# Patient Record
Sex: Male | Born: 1990 | Race: White | Hispanic: No | Marital: Single
Health system: Southern US, Community
[De-identification: ages and names within clinical notes are randomized; demographics above are authoritative.]

---

## 2019-03-06 ENCOUNTER — Emergency Department: Payer: Self-pay

## 2019-03-06 ENCOUNTER — Emergency Department
Admission: EM | Admit: 2019-03-06 | Discharge: 2019-03-06 | Disposition: A | Discharge status: Home / Self Care | Payer: Self-pay | Attending: Emergency Medicine | Admitting: Emergency Medicine

## 2019-03-06 DIAGNOSIS — Y939 Activity, unspecified: Secondary | ICD-10-CM | POA: Insufficient documentation

## 2019-03-06 DIAGNOSIS — S61216A Laceration without foreign body of right little finger without damage to nail, initial encounter: Secondary | ICD-10-CM | POA: Insufficient documentation

## 2019-03-06 DIAGNOSIS — W231XXA Caught, crushed, jammed, or pinched between stationary objects, initial encounter: Secondary | ICD-10-CM | POA: Insufficient documentation

## 2019-03-06 DIAGNOSIS — Y929 Unspecified place or not applicable: Secondary | ICD-10-CM | POA: Insufficient documentation

## 2019-03-06 DIAGNOSIS — Y999 Unspecified external cause status: Secondary | ICD-10-CM | POA: Insufficient documentation

## 2019-03-06 MED ORDER — MELOXICAM 15 MG PO TABS: 15.0000 mg | ORAL_TABLET | Freq: Every day | ORAL | 1 refills | Status: AC

## 2019-03-06 NOTE — ED Triage Notes (Signed)
Pt reports he slammed his right 5th digit into truck door this evening. Swelling and 1/2 inch laceration noted. No bleeding at this time.

## 2019-03-06 NOTE — ED Notes (Signed)
Pt to the er for pain to the 5th digit on the right hand. Pt states he shut his truck door and now has swelling and bruising and a small abrasion to the digit. Pt has good blood flow and cap refill is good. Pt states he has lost feeling in the finger and decreased ROM.

## 2019-03-06 NOTE — ED Provider Notes (Signed)
Emergency Department Provider Note  ____________________________________________  Time seen: Approximately 10:02 PM  I have reviewed the triage vital signs and the nursing notes.   HISTORY  Chief Complaint Finger Injury   Historian Patient     HPI Jeffrey Warren is a 28 y.o. male presents to the emergency department with right fifth digit pain.  Patient shut his car door on his hand.  He has noticed some swelling has a small abrasion along the dorsal aspect of the digit.  Patient has had some numbness of the right fifth digit since injury occurred.  No other alleviating measures have been attempted.  Tetanus status is up-to-date.   No past medical history on file.   Immunizations up to date:  Yes.     No past medical history on file.  There are no problems to display for this patient.     Prior to Admission medications   Medication Sig Start Date End Date Taking? Authorizing Provider  meloxicam (MOBIC) 15 MG tablet Take 1 tablet (15 mg total) by mouth daily for 7 days. 03/06/19 03/13/19  Orvil Feil, PA-C    Allergies Patient has no allergy information on record.  No family history on file.  Social History Social History   Tobacco Use  . Smoking status: Not on file  Substance Use Topics  . Alcohol use: Not on file  . Drug use: Not on file     Review of Systems  Constitutional: No fever/chills Eyes:  No discharge ENT: No upper respiratory complaints. Respiratory: no cough. No SOB/ use of accessory muscles to breath Gastrointestinal:   No nausea, no vomiting.  No diarrhea.  No constipation. Musculoskeletal: Patient has right fifth digit pain. Skin: Negative for rash, abrasions, lacerations, ecchymosis.    ____________________________________________   PHYSICAL EXAM:  VITAL SIGNS: ED Triage Vitals  Enc Vitals Group     BP 03/06/19 1902 (!) 143/88     Pulse Rate 03/06/19 1902 76     Resp 03/06/19 1902 17     Temp 03/06/19 1902 98.6 F  (37 C)     Temp Source 03/06/19 1902 Oral     SpO2 03/06/19 1902 98 %     Weight --      Height --      Head Circumference --      Peak Flow --      Pain Score 03/06/19 1945 4     Pain Loc --      Pain Edu? --      Excl. in GC? --      Constitutional: Alert and oriented. Well appearing and in no acute distress. Eyes: Conjunctivae are normal. PERRL. EOMI. Head: Atraumatic. Cardiovascular: Normal rate, regular rhythm. Normal S1 and S2.  Good peripheral circulation. Respiratory: Normal respiratory effort without tachypnea or retractions. Lungs CTAB. Good air entry to the bases with no decreased or absent breath sounds Gastrointestinal: Bowel sounds x 4 quadrants. Soft and nontender to palpation. No guarding or rigidity. No distention. Musculoskeletal: No flexor or extensor tendon deficits appreciated with right fifth digit testing.  Palpable radial pulse, right.  Capillary refill less than 2 seconds on the right. Neurologic:  Normal for age. No gross focal neurologic deficits are appreciated.  Skin:  Skin is warm, dry and intact. No rash noted. Psychiatric: Mood and affect are normal for age. Speech and behavior are normal.   ____________________________________________   LABS (all labs ordered are listed, but only abnormal results are displayed)  Labs Reviewed -  No data to display ____________________________________________  EKG   ____________________________________________  RADIOLOGY Unk Pinto, personally viewed and evaluated these images (plain radiographs) as part of my medical decision making, as well as reviewing the written report by the radiologist.  DG Finger Little Right  Result Date: 03/06/2019 CLINICAL DATA:  Injury. Slammed fifth digit in truck door this evening. Swelling and laceration. EXAM: RIGHT LITTLE FINGER 2+V COMPARISON:  None. FINDINGS: There is no evidence of fracture or dislocation. There is no evidence of arthropathy or other focal bone  abnormality. Soft tissues are unremarkable. Site of laceration not well-defined. No radiopaque foreign body. IMPRESSION: Negative radiographs of the right little finger. Electronically Signed   By: Keith Rake M.D.   On: 03/06/2019 19:19    ____________________________________________    PROCEDURES  Procedure(s) performed:     Procedures     Medications - No data to display   ____________________________________________   INITIAL IMPRESSION / ASSESSMENT AND PLAN / ED COURSE  Pertinent labs & imaging results that were available during my care of the patient were reviewed by me and considered in my medical decision making (see chart for details).      Assessment and plan Finger contusion 29 year old male presents to the emergency department after he shot his right fifth digit in a car door.  X-ray examination reveals no bony abnormality.  Patient's right fifth digit was splinted into extension and meloxicam was prescribed for pain.  He was advised to follow-up with Dr. Peggye Ley as needed.  Return precautions were given to return with new or worsening symptoms.  All patient questions were answered.   ____________________________________________  FINAL CLINICAL IMPRESSION(S) / ED DIAGNOSES  Final diagnoses:  Laceration of right little finger without foreign body without damage to nail, initial encounter      NEW MEDICATIONS STARTED DURING THIS VISIT:  ED Discharge Orders         Ordered    meloxicam (MOBIC) 15 MG tablet  Daily     03/06/19 1935              This chart was dictated using voice recognition software/Dragon. Despite best efforts to proofread, errors can occur which can change the meaning. Any change was purely unintentional.     Karren Cobble 03/06/19 2205    Blake Divine, MD 03/07/19 [phone]

## 2020-08-08 IMAGING — CR DG FINGER LITTLE 2+V*R*
1 series · 3 of 3 positions shown · non-contrast
Comparison: None.

CLINICAL DATA: Injury. Slammed fifth digit in truck door this
evening. Swelling and laceration.

EXAM:
RIGHT LITTLE FINGER 2+V

[Series 1: x finger pa right · 0.14mm/px · 3 of 3 slices shown]
[im 1/3]
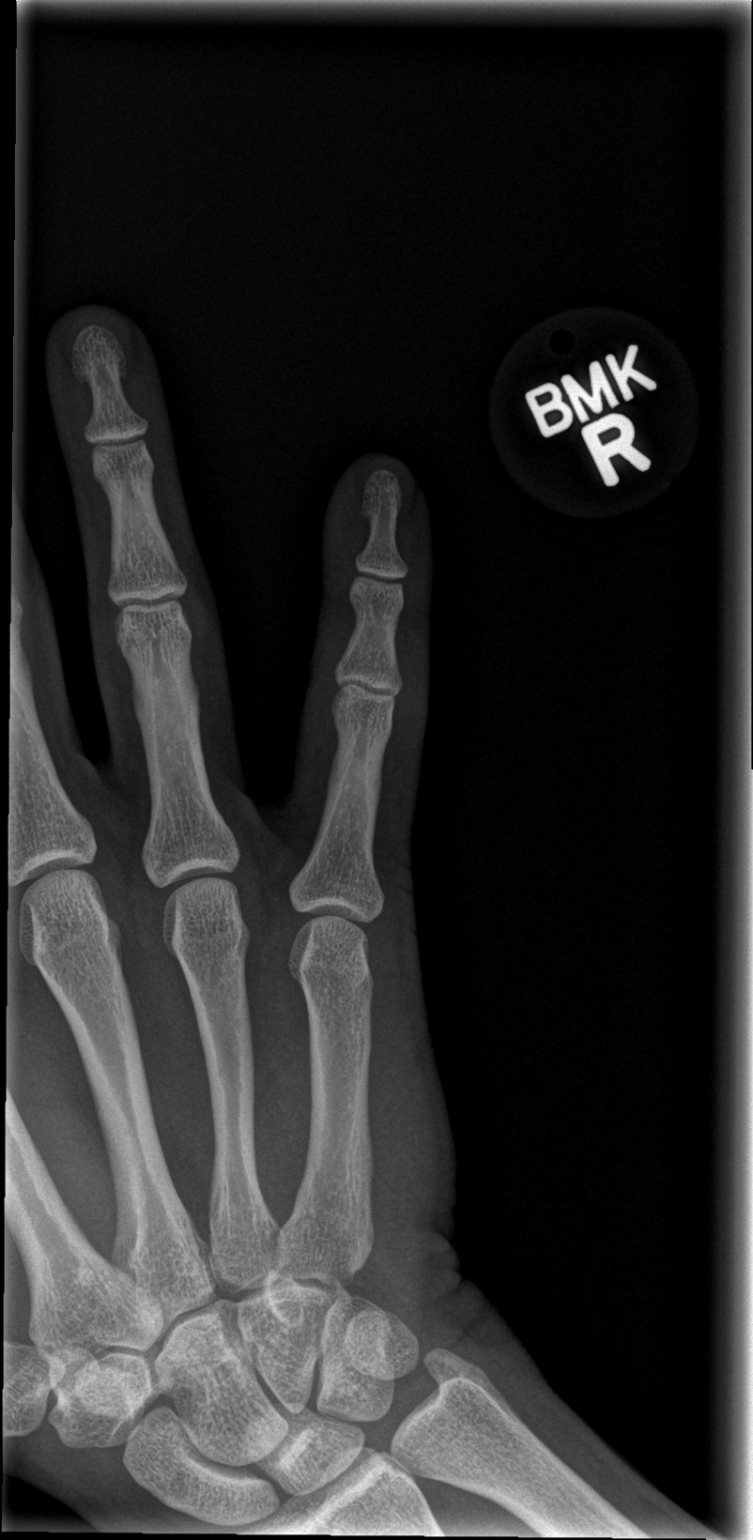
[im 2/3]
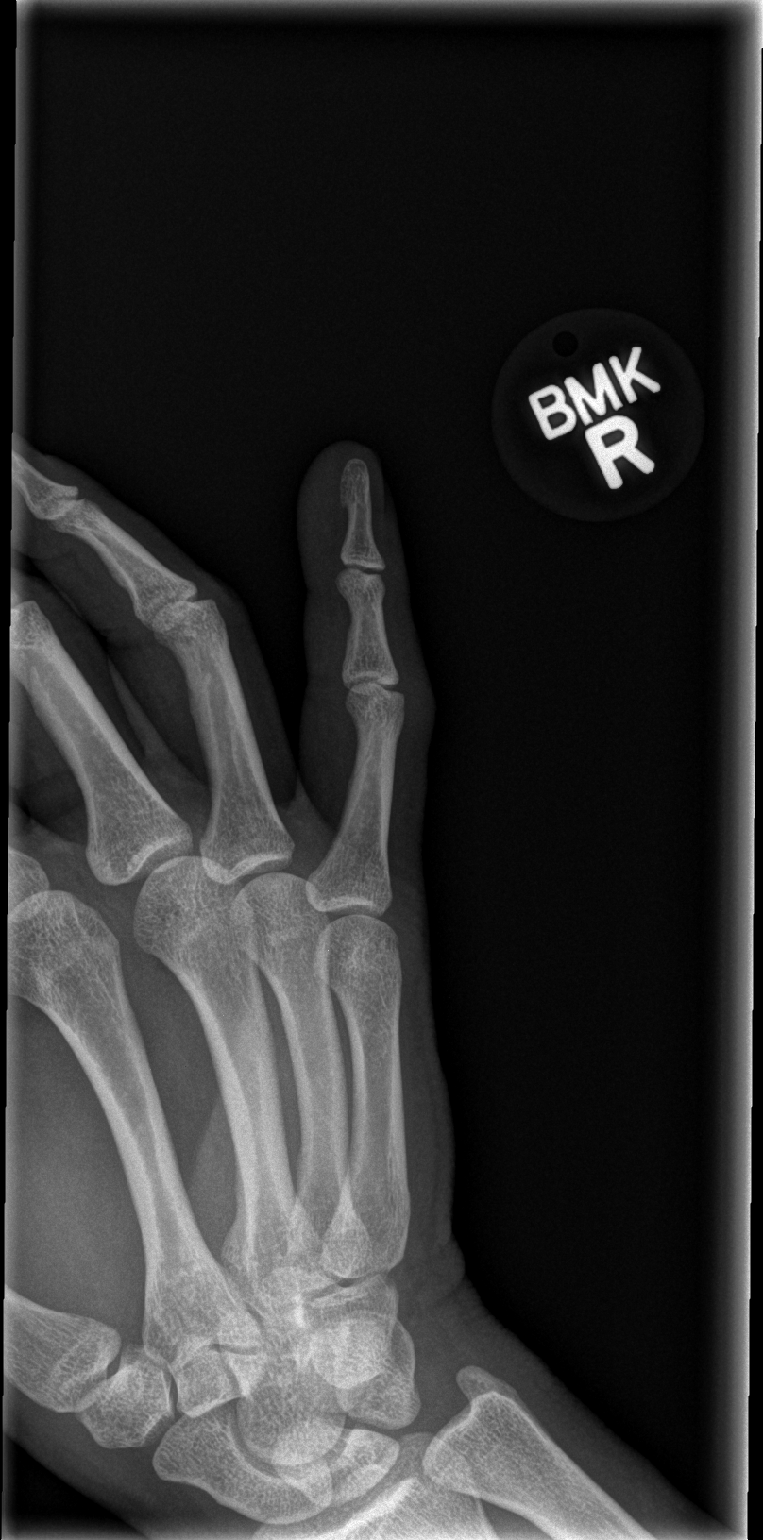
[im 3/3]
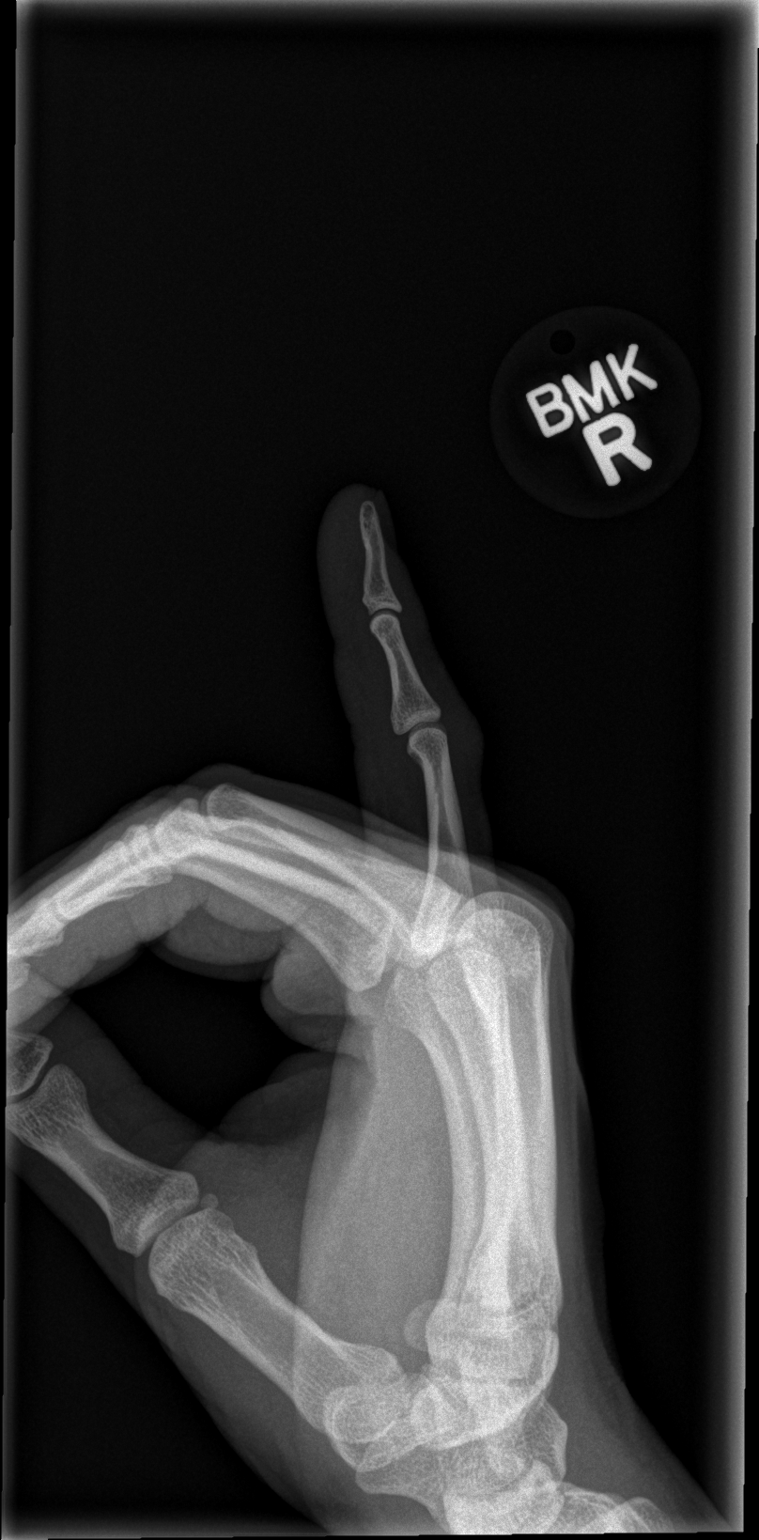

[3 of 3 positions shown; findings below may reference images not displayed]

FINDINGS: There is no evidence of fracture or dislocation. There is no
evidence of arthropathy or other focal bone abnormality. Soft
tissues are unremarkable. Site of laceration not well-defined. No
radiopaque foreign body.
IMPRESSION: Negative radiographs of the right little finger.
